# Patient Record
Sex: Female | Born: 1975
Health system: Southern US, Community
[De-identification: ages and names within clinical notes are randomized; demographics above are authoritative.]

## PROBLEM LIST (undated history)

## (undated) ENCOUNTER — Emergency Department (HOSPITAL_COMMUNITY): Admission: EM | Payer: BC Managed Care – PPO

## (undated) DIAGNOSIS — D649 Anemia, unspecified: Secondary | ICD-10-CM

## (undated) HISTORY — DX: Anemia, unspecified: D64.9

## (undated) HISTORY — PX: TUBAL LIGATION: SHX77

---

## 2016-02-22 ENCOUNTER — Encounter (HOSPITAL_COMMUNITY): Payer: Self-pay | Admitting: *Deleted

## 2016-02-22 ENCOUNTER — Emergency Department (HOSPITAL_COMMUNITY)
Admission: EM | Admit: 2016-02-22 | Discharge: 2016-02-22 | Disposition: A | Discharge status: Home / Self Care | Payer: Self-pay | Attending: Emergency Medicine | Admitting: Emergency Medicine

## 2016-02-22 DIAGNOSIS — H6123 Impacted cerumen, bilateral: Secondary | ICD-10-CM | POA: Insufficient documentation

## 2016-02-22 DIAGNOSIS — J029 Acute pharyngitis, unspecified: Secondary | ICD-10-CM

## 2016-02-22 DIAGNOSIS — J039 Acute tonsillitis, unspecified: Secondary | ICD-10-CM | POA: Insufficient documentation

## 2016-02-22 DIAGNOSIS — Z87891 Personal history of nicotine dependence: Secondary | ICD-10-CM | POA: Insufficient documentation

## 2016-02-22 LAB — RAPID STREP SCREEN (MED CTR MEBANE ONLY): Streptococcus, Group A Screen (Direct): NEGATIVE

## 2016-02-22 MED ORDER — PHENOL 1.4 % MT LIQD
1.0000 | OROMUCOSAL | Status: DC | PRN
  Administered 2016-02-22: 1 via OROMUCOSAL
  Filled 2016-02-22: qty 177

## 2016-02-22 NOTE — ED Notes (Signed)
Declined W/C at D/C and was escorted to lobby by RN. 

## 2016-02-22 NOTE — Discharge Instructions (Signed)
Continue to stay well-hydrated. Gargle warm salt water and spit it out. Use chloraseptic spray as needed for sore throat. Continue to alternate between Tylenol and Ibuprofen for pain or fever. If you develop cough, try using Mucinex for cough suppression/expectoration of mucus. If you develop any runny nose or sinus congestion then use netipot and flonase to help with nasal congestion. May consider over-the-counter Benadryl or other antihistamine to decrease secretions and for help with your symptoms. Your ears both had a lot of wax in them, you may want to consider over the counter ear drops to soften the wax so it comes out on its own; DO NOT USE QTIPS! Follow up with your primary care doctor in 5-7 days for recheck of ongoing symptoms. Return to emergency department for emergent changing or worsening of symptoms.

## 2016-02-22 NOTE — ED Triage Notes (Signed)
PT reports sore throat ,ear pain and HA started on SAt. Pt reports she has had strep throat in the past.

## 2016-02-22 NOTE — ED Notes (Signed)
"   Video Visit Coupon # [id number] [facility] " given to PT

## 2016-02-22 NOTE — ED Provider Notes (Signed)
[facility]Y [facility] Provider Note   CSN: [id number] Arrival date & time: 02/22/16  0702     History   Chief Complaint Chief Complaint  Patient presents with  . Sore Throat    HPI Glenda Harrell is a 41 y.o. female who presents to the ED with complaints of sore throat 1 day. She describes her pain as 4/10 constant soreness in the posterior throat radiating to the right ear, worse with swallowing, and with no treatments tried prior to arrival. 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 She denies any drooling, trismus, ear drainage, rhinorrhea, cough, fevers, chills, CP, SOB, abd pain, N/V/D/C, hematuria, dysuria, myalgias, arthralgias, numbness, tingling, weakness, or any other complaints at this time.    The history is provided by the patient and medical records. No language interpreter was used.  Sore Throat  This is a new problem. The current episode started yesterday. The problem occurs constantly. The problem has not changed since onset.Pertinent negatives include no chest pain, no abdominal pain and no shortness of breath. The symptoms are aggravated by swallowing. Nothing relieves the symptoms. She has tried nothing for the symptoms. The treatment provided no relief.    History reviewed. No pertinent past medical history.  There are no active problems to display for this patient.   Past Surgical History:  Procedure Laterality Date  . TUBAL LIGATION Bilateral     OB History    No data available       Home Medications    Prior to Admission medications   Not on File    Family History History reviewed. No pertinent family history.  Social History Social History  Substance Use Topics  . Smoking status: Former Games developer  . Smokeless tobacco: Former Neurosurgeon  . Alcohol  use Yes     Comment: social     Allergies   Other   Review of Systems Review of Systems  Constitutional: Negative for chills and fever.  HENT: Positive for ear pain and sore throat. Negative for drooling, ear discharge, rhinorrhea and trouble swallowing.   Respiratory: Negative for cough and shortness of breath.   Cardiovascular: Negative for chest pain.  Gastrointestinal: Negative for abdominal pain, constipation, diarrhea, nausea and vomiting.  Genitourinary: Negative for dysuria and hematuria.  Musculoskeletal: Negative for arthralgias and myalgias.  Skin: Negative for color change.  Allergic/Immunologic: Negative for immunocompromised state.  Neurological: Negative for weakness and numbness.  Psychiatric/Behavioral: Negative for confusion.   10 Systems reviewed and are negative for acute change except as noted in the HPI.   Physical Exam Updated Vital Signs BP 123/73 (BP Location: Right Arm)   Pulse 79   Temp 99.4 F (37.4 C) (Oral)   Resp 20   LMP 02/22/2016   SpO2 98%   Physical Exam  Constitutional: She is oriented to person, place, and time. Vital signs are normal. She appears well-developed and well-nourished.  Non-toxic appearance. No distress.  Afebrile, nontoxic, NAD  HENT:  Head: Normocephalic and atraumatic.  Right Ear: Hearing and external ear normal. A foreign body (cerumen impaction) is present.  Left Ear: Hearing and external ear normal. A foreign body (cerumen impaction) is present.  Nose: Nose normal.  Mouth/Throat: Uvula is midline and mucous membranes are normal. No trismus in the jaw. No  uvula swelling. Posterior oropharyngeal edema and posterior oropharyngeal erythema present. No oropharyngeal exudate or tonsillar abscesses. Tonsils are 2+ on the right. Tonsils are 2+ on the left. No tonsillar exudate.  Ear canals with cerumen impactions bilaterally, so unable to visualize TM. Nose clear. Oropharynx difficult to fully visualize due to pt not fully  cooperating well with exam, but overall injected without uvular swelling or deviation, no trismus or drooling, with 2+ bilateral tonsillar swelling and erythema, no exudates.  No evidence of ludwig's or PTA  Eyes: Conjunctivae and EOM are normal. Right eye exhibits no discharge. Left eye exhibits no discharge.  Neck: Normal range of motion. Neck supple.  Cardiovascular: Normal rate and intact distal pulses.   Pulmonary/Chest: Effort normal. No respiratory distress.  Abdominal: Normal appearance. She exhibits no distension.  Musculoskeletal: Normal range of motion.  Lymphadenopathy:       Head (right side): Tonsillar adenopathy present.       Head (left side): Tonsillar adenopathy present.  Tonsillar LAD bilaterally, tender on the R and nontender on the L  Neurological: She is alert and oriented to person, place, and time. She has normal strength. No sensory deficit.  Skin: Skin is warm, dry and intact. No rash noted.  Psychiatric: She has a normal mood and affect. Her behavior is normal.  Nursing note and vitals reviewed.    ED Treatments / Results  Labs (all labs ordered are listed, but only abnormal results are displayed) Labs Reviewed  RAPID STREP SCREEN (NOT AT [facility])  CULTURE, GROUP A STREP [facility])    EKG  EKG Interpretation None       Radiology No results found.  Procedures Procedures (including critical care time)  Medications Ordered in ED Medications  phenol (CHLORASEPTIC) mouth spray 1 spray (not administered)     Initial Impression / Assessment and Plan / ED Course  I have reviewed the triage vital signs and the nursing notes.  Pertinent labs & imaging results that were available during my care of the patient were reviewed by me and considered in my medical decision making (see chart for details).  Clinical Course     41 y.o. female here with sore throat x1 day which radiates to R ear. Hx of strep. On exam, tonsils 2+ bilaterally with erythema but no  visible exudates although pt not greatly cooperative with visualization of posterior oropharynx; handling secretions , no evidence of ludwig's or PTA. Ears with bilateral cerumen impaction so unable to visualize fully, but pt states ear pain is from the throat pain. Moderate R tonsillar LAD tenderness, L tonsillar LAD nontender. Will obtain RST and give chloraseptic spray. Will reassess shortly  8:11 AM RST neg. Likely viral etiology. Advised OTC remedies to help with symptoms. Also advised OTC ear drops for ear wax if desired, but AVOID QTIP use instructed. F/up with PCP in 1wk for recheck. I explained the diagnosis and have given explicit precautions to return to the ER including for any other new or worsening symptoms. The patient understands and accepts the medical plan as it's been dictated and I have answered their questions. Discharge instructions concerning home care and prescriptions have been given. The patient is STABLE and is discharged to home in good condition.   Final Clinical Impressions(s) / ED Diagnoses   Final diagnoses:  Sore throat  Tonsillitis  Bilateral impacted cerumen    New Prescriptions New Prescriptions   No medications on file     [address] [place] 02/22/16 [phone]  Melene Planan Floyd, DO 02/23/16 1054

## 2016-02-24 LAB — CULTURE, GROUP A STREP (THRC)

## 2016-04-13 ENCOUNTER — Encounter (HOSPITAL_COMMUNITY): Payer: Self-pay | Admitting: Emergency Medicine

## 2016-04-13 ENCOUNTER — Ambulatory Visit (HOSPITAL_COMMUNITY)
Admission: EM | Admit: 2016-04-13 | Discharge: 2016-04-13 | Disposition: A | Discharge status: Home / Self Care | Payer: Self-pay | Attending: Family Medicine | Admitting: Family Medicine

## 2016-04-13 DIAGNOSIS — M542 Cervicalgia: Secondary | ICD-10-CM

## 2016-04-13 MED ORDER — CYCLOBENZAPRINE HCL 10 MG PO TABS: 10.0000 mg | ORAL_TABLET | Freq: Two times a day (BID) | ORAL | 0 refills | Status: DC | PRN

## 2016-04-13 MED ORDER — NAPROXEN 500 MG PO TABS: 500.0000 mg | ORAL_TABLET | Freq: Two times a day (BID) | ORAL | 0 refills | Status: DC

## 2016-04-13 MED FILL — CYCLOBENZAPRINE 10 MG TAB: 10 | 10 days supply | Qty: 20 | Fill #0

## 2016-04-13 MED FILL — NAPROXEN 500 MG TABLET: 500 | 10 days supply | Qty: 20 | Fill #0

## 2016-04-13 NOTE — ED Provider Notes (Signed)
CSN: [id number]     Arrival date & time 04/13/16  1041 History   None    Chief Complaint  Patient presents with  . Torticollis  . Shoulder Pain   (Consider location/radiation/quality/duration/timing/severity/associated sxs/prior Treatment) Patient c/o neck stiffness and left shoulder discomfort starting yesterday.  Patient awoke with the neck discomfort.   The history is provided by the patient.  Shoulder Pain  Location:  Shoulder Shoulder location:  L shoulder Injury: no   Pain details:    Quality:  Aching   Radiates to:  Does not radiate   Severity:  Moderate   Onset quality:  Sudden   Duration:  2 days   Timing:  Constant   Progression:  Worsening Handedness:  Right-handed Dislocation: no   Foreign body present:  No foreign bodies Tetanus status:  Unknown Prior injury to area:  Yes Relieved by:  Nothing Worsened by:  Nothing   History reviewed. No pertinent past medical history. Past Surgical History:  Procedure Laterality Date  . TUBAL LIGATION Bilateral    History reviewed. No pertinent family history. Social History  Substance Use Topics  . Smoking status: Former Games developermoker  . Smokeless tobacco: Former NeurosurgeonUser  . Alcohol use Yes     Comment: social   OB History    No data available     Review of Systems  Constitutional: Negative.   HENT: Negative.   Eyes: Negative.   Respiratory: Negative.   Cardiovascular: Negative.   Gastrointestinal: Negative.   Endocrine: Negative.   Genitourinary: Negative.   Musculoskeletal: Positive for arthralgias.  Skin: Negative.   Allergic/Immunologic: Negative.   Neurological: Negative.   Hematological: Negative.   Psychiatric/Behavioral: Negative.     Allergies  Other  Home Medications   Prior to Admission medications   Medication Sig Start Date End Date Taking? Authorizing Provider  cyclobenzaprine (FLEXERIL) 10 MG tablet Take 1 tablet (10 mg total) by mouth 2 (two) times daily as needed for muscle spasms. 04/13/16    Deatra CanterWilliam J Breuna Loveall, FNP  naproxen (NAPROSYN) 500 MG tablet Take 1 tablet (500 mg total) by mouth 2 (two) times daily with a meal. 04/13/16   Deatra CanterWilliam J Victoria Euceda, FNP   Meds Ordered and Administered this Visit  Medications - No data to display  BP (!) 113/53 (BP Location: Right Arm)   Pulse 76   Temp 98 F (36.7 C) (Oral)   Resp 18   SpO2 100%  No data found.   Physical Exam  Constitutional: She appears well-developed and well-nourished.  HENT:  Head: Normocephalic and atraumatic.  Eyes: Conjunctivae and EOM are normal. Pupils are equal, round, and reactive to light.  Neck: Normal range of motion. Neck supple.  Cardiovascular: Normal rate, regular rhythm and normal heart sounds.   Pulmonary/Chest: Effort normal and breath sounds normal.  Musculoskeletal: She exhibits tenderness.  TTP cervical paraspinous muscles.  Left trapezius muscle discomfort.  Nursing note and vitals reviewed.   Urgent Care Course     Procedures (including critical care time)  Labs Review Labs Reviewed - No data to display  Imaging Review No results found.   Visual Acuity Review  Right Eye Distance:   Left Eye Distance:   Bilateral Distance:    Right Eye Near:   Left Eye Near:    Bilateral Near:         MDM   1. Cervicalgia   2. Neck pain    Naprosyn 500mg  one po bid x 10 days #20 Flexeril 10mg  one po  bid prn #20      Deatra Canter, FNP 04/13/16 1222

## 2016-04-13 NOTE — ED Triage Notes (Signed)
The patient presented to the [facility] with a complaint of neck stiffness and shoulder pain that started yesterday as a result of a previous injury.

## 2020-02-12 ENCOUNTER — Other Ambulatory Visit: Payer: Self-pay

## 2020-02-12 ENCOUNTER — Other Ambulatory Visit (HOSPITAL_COMMUNITY): Payer: Self-pay | Admitting: Urgent Care

## 2020-02-12 ENCOUNTER — Ambulatory Visit (HOSPITAL_COMMUNITY)
Admission: EM | Admit: 2020-02-12 | Discharge: 2020-02-12 | Disposition: A | Discharge status: Home / Self Care | Payer: BC Managed Care – PPO | Attending: Urgent Care | Admitting: Urgent Care

## 2020-02-12 ENCOUNTER — Encounter (HOSPITAL_COMMUNITY): Payer: Self-pay | Admitting: Emergency Medicine

## 2020-02-12 DIAGNOSIS — Z20822 Contact with and (suspected) exposure to covid-19: Secondary | ICD-10-CM | POA: Insufficient documentation

## 2020-02-12 DIAGNOSIS — F17200 Nicotine dependence, unspecified, uncomplicated: Secondary | ICD-10-CM | POA: Diagnosis present

## 2020-02-12 DIAGNOSIS — J069 Acute upper respiratory infection, unspecified: Secondary | ICD-10-CM

## 2020-02-12 MED ORDER — CETIRIZINE HCL 10 MG PO TABS: 10.0000 mg | ORAL_TABLET | Freq: Every day | ORAL | 0 refills | Status: DC

## 2020-02-12 MED ORDER — PROMETHAZINE-DM 6.25-15 MG/5ML PO SYRP: 5.0000 mL | ORAL_SOLUTION | Freq: Every evening | ORAL | 0 refills | Status: DC | PRN

## 2020-02-12 MED ORDER — BENZONATATE 100 MG PO CAPS
100.0000 mg | ORAL_CAPSULE | Freq: Three times a day (TID) | ORAL | 0 refills | Status: DC | PRN
Start: 2020-02-12 — End: 2020-02-12

## 2020-02-12 MED ORDER — PSEUDOEPHEDRINE HCL 60 MG PO TABS: 60.0000 mg | ORAL_TABLET | Freq: Three times a day (TID) | ORAL | 0 refills | Status: DC | PRN

## 2020-02-12 MED FILL — BENZONATATE 100 MG CAPS: 100 | 10 days supply | Qty: 60 | Fill #0

## 2020-02-12 MED FILL — PROMETHAZINE W/DM SYRUP: 6.25-15 | 20 days supply | Qty: 100 | Fill #0

## 2020-02-12 NOTE — ED Triage Notes (Signed)
Patient c/o generalized body aches and nonproductive cough x 1 day.   Patient denies fever at home.   Patient took Alkaseltzer OTC medication w/ no relief of symptoms.

## 2020-02-12 NOTE — Discharge Instructions (Addendum)

## 2020-02-12 NOTE — ED Provider Notes (Signed)
  [facility]   MRN: [id number] DOB: [birth year]  Subjective:   Glenda Harrell is a 45 y.o. female presenting for 1 day history of body aches, dry cough, upper back pain. Spent a lot of time driving, works for [employer]. Denies history of asthma. Denies chest pain, shob. She is a smoker. No traumas, falls. She is COVID vaccinated.   No current facility-administered medications for this encounter.  Current Outpatient Medications:  .  cyclobenzaprine (FLEXERIL) 10 MG tablet, Take 1 tablet (10 mg total) by mouth 2 (two) times daily as needed for muscle spasms., Disp: 20 tablet, Rfl: 0 .  naproxen (NAPROSYN) 500 MG tablet, Take 1 tablet (500 mg total) by mouth 2 (two) times daily with a meal., Disp: 20 tablet, Rfl: 0   Allergies  Allergen Reactions  . Other Anaphylaxis    Shell fish and horseradish    No past medical history on file.   Past Surgical History:  Procedure Laterality Date  . TUBAL LIGATION Bilateral     No family history on file.  Social History   Tobacco Use  . Smoking status: Former Games developer  . Smokeless tobacco: Former Engineer, water Use Topics  . Alcohol use: Yes    Comment: social  . Drug use: No    ROS   Objective:   Vitals: Ht 5\' 9"  (1.753 m)   Wt 165 lb (74.8 kg)   BMI 24.37 kg/m   Physical Exam Constitutional:      General: She is not in acute distress.    Appearance: Normal appearance. She is well-developed. She is obese. She is not ill-appearing, toxic-appearing or diaphoretic.  HENT:     Head: Normocephalic and atraumatic.     Nose: Nose normal.     Mouth/Throat:     Mouth: Mucous membranes are moist.  Eyes:     Extraocular Movements: Extraocular movements intact.     Pupils: Pupils are equal, round, and reactive to light.  Cardiovascular:     Rate and Rhythm: Normal rate and regular rhythm.     Pulses: Normal pulses.     Heart sounds: Normal heart sounds. No murmur heard. No friction rub. No gallop.   Pulmonary:      Effort: Pulmonary effort is normal. No respiratory distress.     Breath sounds: Normal breath sounds. No stridor. No wheezing, rhonchi or rales.  Skin:    General: Skin is warm and dry.     Findings: No rash.  Neurological:     Mental Status: She is alert and oriented to person, place, and time.  Psychiatric:        Mood and Affect: Mood normal.        Behavior: Behavior normal.        Thought Content: Thought content normal.     Assessment and Plan :   PDMP not reviewed this encounter.  1. Viral URI with cough   2. Smoker     Will manage for viral illness such as viral URI, viral syndrome, viral rhinitis, COVID-19. Counseled patient on nature of COVID-19 including modes of transmission, diagnostic testing, management and supportive care.  Offered scripts for symptomatic relief. COVID 19 testing is pending. Counseled patient on potential for adverse effects with medications prescribed/recommended today, ER and return-to-clinic precautions discussed, patient verbalized understanding.     , PA-C 02/12/20 1521

## 2020-02-13 LAB — SARS CORONAVIRUS 2 (TAT 6-24 HRS): SARS Coronavirus 2: NEGATIVE

## 2020-06-19 ENCOUNTER — Other Ambulatory Visit: Payer: Self-pay

## 2020-06-19 ENCOUNTER — Encounter: Payer: Self-pay | Admitting: Nurse Practitioner

## 2020-06-19 ENCOUNTER — Ambulatory Visit (INDEPENDENT_AMBULATORY_CARE_PROVIDER_SITE_OTHER): Payer: BC Managed Care – PPO | Admitting: Nurse Practitioner

## 2020-06-19 VITALS — BP 128/74 | HR 59 | Temp 98.3°F | Ht 68.2 in | Wt 171.0 lb

## 2020-06-19 DIAGNOSIS — R109 Unspecified abdominal pain: Secondary | ICD-10-CM

## 2020-06-19 DIAGNOSIS — Z0000 Encounter for general adult medical examination without abnormal findings: Secondary | ICD-10-CM | POA: Diagnosis not present

## 2020-06-19 DIAGNOSIS — Z1231 Encounter for screening mammogram for malignant neoplasm of breast: Secondary | ICD-10-CM

## 2020-06-19 DIAGNOSIS — Z7689 Persons encountering health services in other specified circumstances: Secondary | ICD-10-CM

## 2020-06-19 DIAGNOSIS — H6122 Impacted cerumen, left ear: Secondary | ICD-10-CM | POA: Diagnosis not present

## 2020-06-19 DIAGNOSIS — N926 Irregular menstruation, unspecified: Secondary | ICD-10-CM | POA: Diagnosis not present

## 2020-06-19 DIAGNOSIS — E559 Vitamin D deficiency, unspecified: Secondary | ICD-10-CM | POA: Diagnosis not present

## 2020-06-19 DIAGNOSIS — R5383 Other fatigue: Secondary | ICD-10-CM | POA: Diagnosis not present

## 2020-06-19 LAB — CBC
Hematocrit: 35.1 % (ref 34.0–46.6)
Hemoglobin: 11.2 g/dL (ref 11.1–15.9)
MCH: 26 pg — ABNORMAL LOW (ref 26.6–33.0)
MCHC: 31.9 g/dL (ref 31.5–35.7)
MCV: 81 fL (ref 79–97)
Platelets: 234 10*3/uL (ref 150–450)
RBC: 4.31 x10E6/uL (ref 3.77–5.28)
RDW: 14.5 % (ref 11.7–15.4)
WBC: 4 10*3/uL (ref 3.4–10.8)

## 2020-06-19 LAB — POCT URINALYSIS DIPSTICK
Bilirubin, UA: NEGATIVE
Blood, UA: NEGATIVE
Glucose, UA: NEGATIVE
Ketones, UA: NEGATIVE
Leukocytes, UA: NEGATIVE
Nitrite, UA: NEGATIVE
Protein, UA: NEGATIVE
Spec Grav, UA: 1.015 (ref 1.010–1.025)
Urobilinogen, UA: 0.2 E.U./dL
pH, UA: 7.5 (ref 5.0–8.0)

## 2020-06-19 NOTE — Progress Notes (Signed)
I,Tianna Badgett,acting as a Education administrator for [employer], NP.,have documented all relevant documentation on the behalf of [employer], NP,as directed by  Bary Castilla, NP while in the presence of Bary Castilla, NP.  This visit occurred during the SARS-CoV-2 public health emergency.  Safety protocols were in place, including screening questions prior to the visit, additional usage of staff PPE, and extensive cleaning of exam room while observing appropriate contact time as indicated for disinfecting solutions.  Subjective:     Patient ID: Glenda Harrell , female    DOB: 1975/02/15 , 45 y.o.   MRN: [id number]   Chief Complaint  Patient presents with  . Establish Care    HPI  Patient is here to establish care. She would like a physical today. She has not been seen by a PCP since 2016 when she lived in [place]. She goes to the urgent care if she needs to.  [facility] She has had it within last year.  She is having flank pain today as well as headache. She took ibuprofen with the headache. She is stressed with work, school and work. Her BP Today was 128/74 Pulse 59. She does have anemia.  LMP: May 8th 2022. She has not had regular cycle.  Diet: She eats a lot of red meat. She eats a lot of salt. Drink Coffee. No soda. Drinks water. Eats vegetables. She doesn't eat 3 meals a day. She doenst eat pork.  Exercise: She walks.  She is not sexaully active. She has one boy. 44 years old     Past Medical History:  Diagnosis Date  . Anemia      Family History  Problem Relation Age of Onset  . Stroke Mother   . Hypertension Maternal Grandmother     No current outpatient medications on file.   Allergies  Allergen Reactions  . Other Anaphylaxis    Shell fish and horseradish      The patient states she uses none for birth control. Last LMP 06/15/20  Negative for: breast discharge, breast lump(s), breast pain and breast self exam.  Associated symptoms include abnormal vaginal bleeding. Pertinent negatives include abnormal bleeding (hematology), anxiety, decreased libido, depression, difficulty falling sleep, dyspareunia, history of infertility, nocturia, sexual dysfunction, sleep disturbances, urinary incontinence, urinary urgency, vaginal discharge and vaginal itching. Diet regular.The patient states her exercise level is    . The patient's tobacco use is: none  Social History   Tobacco Use  Smoking Status Current Every Day Smoker  . Packs/day: 0.50  . Types: Cigarettes  Smokeless Tobacco Former Systems developer  . She has been exposed to passive smoke. The patient's alcohol use is:  Social History   Substance and Sexual Activity  Alcohol Use Yes   Comment: social  . Additional information: Last pap (last year patient had it done with school OBG[facility]).  Review of Systems  Constitutional: Negative.  Negative for chills, fatigue and fever.  HENT: Negative.  Negative for congestion, ear discharge, sinus pressure and sinus pain.   Eyes: Positive for visual disturbance.       Patient to make appt with eye doc  Respiratory: Negative.  Negative for shortness of breath and wheezing.   Cardiovascular: Negative.  Negative for chest pain and palpitations.  Gastrointestinal: Negative.  Negative for constipation, diarrhea, nausea and vomiting.  Endocrine: Negative.  Negative for polydipsia, polyphagia and polyuria.  Genitourinary: Positive for flank pain.  Musculoskeletal: Negative for arthralgias, back pain  and myalgias.  Skin: Negative.   Allergic/Immunologic: Negative.   Neurological: Positive for headaches. Negative for numbness.  Hematological: Negative.   Psychiatric/Behavioral: Negative.      Today's Vitals   06/19/20 1055  BP: 128/74  Pulse: (!) 59  Temp: 98.3 F (36.8 C)  TempSrc: Oral  Weight: 171 lb (77.6 kg)  Height: 5' 8.2" (1.732 m)   Body mass index is 25.85 kg/m.   Objective:  Physical Exam Vitals and  nursing note reviewed.  Constitutional:      Appearance: Normal appearance.  HENT:     Head: Normocephalic and atraumatic.     Right Ear: Tympanic membrane, ear canal and external ear normal.     Left Ear: Tympanic membrane, ear canal and external ear normal. There is impacted cerumen.     Nose: Nose normal.     Mouth/Throat:     Mouth: Mucous membranes are moist.     Pharynx: Oropharynx is clear.  Eyes:     Extraocular Movements: Extraocular movements intact.     Conjunctiva/sclera: Conjunctivae normal.     Pupils: Pupils are equal, round, and reactive to light.  Cardiovascular:     Rate and Rhythm: Normal rate and regular rhythm.     Pulses: Normal pulses.     Heart sounds: Normal heart sounds.  Pulmonary:     Effort: Pulmonary effort is normal.     Breath sounds: Normal breath sounds.  Chest:  Breasts:     Tanner Score is 5.     Right: Normal.     Left: Normal.    Abdominal:     General: Abdomen is flat. Bowel sounds are normal.     Palpations: Abdomen is soft.  Genitourinary:    Comments: Deferred will see a OBYGN  Musculoskeletal:        General: Normal range of motion.     Cervical back: Normal range of motion and neck supple.  Skin:    General: Skin is warm and dry.     Capillary Refill: Capillary refill takes less than 2 seconds.  Neurological:     General: No focal deficit present.     Mental Status: She is alert and oriented to person, place, and time.  Psychiatric:        Mood and Affect: Mood normal.        Behavior: Behavior normal.         Assessment And Plan:     1. Establishing care with new doctor, encounter for -Patient is here to establish care. Martin Majestic over patient medical, family, social and surgical history. -Reviewed with patient their medications and any allergies  -Reviewed with patient their sexual orientation, drug/tobacco and alcohol use -Dicussed any new concerns with patient  -recommended patient comes in for a physical exam and  complete blood work.  -Educated patient about the importance of annual screenings and immunizations.  -Advised patient to eat a healthy diet along with exercise for atleast 30-45 min atleast 4-5 days of the week.   2. Encounter for annual physical exam --Patient is here for their annual physical exam and we discussed any changes to medication and medical history.  -Behavior modification was discussed as well as diet and exercise history  -Patient will continue to exercise regularly and modify their diet.  -Recommendation for yearly physical annuals, immunization and screenings including mammogram and colonoscopy were discussed with the patient.  -Recommended intake of multivitamin, vitamin D and calcium.  -Individualized advise was given to the patient  pertaining to their own health history in regards to diet, exercise, medical condition and referrals.  - CBC - Hemoglobin A1c - CMP14+EGFR - Lipid panel - Hepatitis C antibody - HIV Antibody (routine testing w rflx)  3. Irregular menses - Ambulatory referral to Obstetrics / Gynecology  4. Fatigue, unspecified type - TSH + free T4 - T3, free  5. Vitamin D deficiency -Will assess and supplement as needed. Advised patient to spend 15 min. In the sunlight.   - Vitamin D (25 hydroxy)  6. Flank pain - POCT Urinalysis Dipstick (81002)  7. Impacted cerumen of left ear -Irrigated left ear with curette.   8. Screening mammogram, encounter for - MM Digital Screening; Future  Staying healthy and adopting a healthy lifestyle for your overall health is important. You should eat 7 or more servings of fruits and vegetables per day. You should drink plenty of water to keep yourself hydrated and your kidneys healthy. This includes about 65-80+ fluid ounces of water. Limit your intake of animal fats especially for elevated cholesterol. Avoid highly processed food and limit your salt intake if you have hypertension. Avoid foods high in  saturated/Trans fats. Along with a healthy diet it is also very important to maintain time for yourself to maintain a healthy mental health with low stress levels. You should get atleast 150 min of moderate intensity exercise weekly for a healthy heart. Along with eating right and exercising, aim for at least 7-9 hours of sleep daily.  Eat more whole grains which includes barley, wheat berries, oats, brown rice and whole wheat pasta. Use healthy plant oils which include olive, soy, corn, sunflower and peanut. Limit your caffeine and sugary drinks. Limit your intake of fast foods. Limit milk and dairy products to one or two daily servings.    Patient was given opportunity to ask questions. Patient verbalized understanding of the plan and was able to repeat key elements of the plan. All questions were answered to their satisfaction.  Glenda Rathana Viveros, DNP   I, Glenda Harrell have reviewed all documentation for this visit. The documentation on 06/19/20 for the exam, diagnosis, procedures, and orders are all accurate and complete.    THE PATIENT IS ENCOURAGED TO PRACTICE SOCIAL DISTANCING DUE TO THE COVID-19 PANDEMIC.

## 2020-06-25 ENCOUNTER — Other Ambulatory Visit (HOSPITAL_COMMUNITY): Payer: Self-pay

## 2020-06-25 ENCOUNTER — Other Ambulatory Visit: Payer: Self-pay | Admitting: Nurse Practitioner

## 2020-06-25 DIAGNOSIS — E7800 Pure hypercholesterolemia, unspecified: Secondary | ICD-10-CM

## 2020-06-25 DIAGNOSIS — E559 Vitamin D deficiency, unspecified: Secondary | ICD-10-CM

## 2020-06-25 LAB — LIPID PANEL
Chol/HDL Ratio: 3 ratio (ref 0.0–4.4)
Cholesterol, Total: 238 mg/dL — ABNORMAL HIGH (ref 100–199)
HDL: 79 mg/dL (ref >39)
LDL Chol Calc (NIH): 142 mg/dL — ABNORMAL HIGH (ref 0–99)
Triglycerides: 96 mg/dL (ref 0–149)
VLDL Cholesterol Cal: 17 mg/dL (ref 5–40)

## 2020-06-25 LAB — CMP14+EGFR
ALT: 10 IU/L (ref 0–32)
AST: 19 IU/L (ref 0–40)
Albumin/Globulin Ratio: 1.9 (ref 1.2–2.2)
Albumin: 4.4 g/dL (ref 3.8–4.8)
Alkaline Phosphatase: 92 IU/L (ref 44–121)
BUN/Creatinine Ratio: 9 (ref 9–23)
BUN: 7 mg/dL (ref 6–24)
Bilirubin Total: <0.2 mg/dL (ref 0.0–1.2)
CO2: 23 mmol/L (ref 20–29)
Calcium: 9.1 mg/dL (ref 8.7–10.2)
Chloride: 104 mmol/L (ref 96–106)
Creatinine, Ser: 0.81 mg/dL (ref 0.57–1.00)
Globulin, Total: 2.3 g/dL (ref 1.5–4.5)
Glucose: 86 mg/dL (ref 65–99)
Potassium: 4.8 mmol/L (ref 3.5–5.2)
Sodium: 140 mmol/L (ref 134–144)
Total Protein: 6.7 g/dL (ref 6.0–8.5)
eGFR: 92 mL/min/{1.73_m2} (ref >59)

## 2020-06-25 LAB — T3, FREE: T3, Free: 2.9 pg/mL (ref 2.0–4.4)

## 2020-06-25 LAB — HEMOGLOBIN A1C
Est. average glucose Bld gHb Est-mCnc: 126 mg/dL
Hgb A1c MFr Bld: 6 % — ABNORMAL HIGH (ref 4.8–5.6)

## 2020-06-25 LAB — TSH+FREE T4
Free T4: 1.11 ng/dL (ref 0.82–1.77)
TSH: 1.12 u[IU]/mL (ref 0.450–4.500)

## 2020-06-25 LAB — HEPATITIS C ANTIBODY: Hep C Virus Ab: <0.1 s/co ratio (ref 0.0–0.9)

## 2020-06-25 LAB — VITAMIN D 25 HYDROXY (VIT D DEFICIENCY, FRACTURES): Vit D, 25-Hydroxy: 17.9 ng/mL — ABNORMAL LOW (ref 30.0–100.0)

## 2020-06-25 LAB — HIV ANTIBODY (ROUTINE TESTING W REFLEX): HIV Screen 4th Generation wRfx: NONREACTIVE

## 2020-06-25 MED ORDER — VITAMIN D (ERGOCALCIFEROL) 1.25 MG (50000 UNIT) PO CAPS
ORAL_CAPSULE | ORAL | 0 refills | Status: DC
  Filled 2020-06-25: qty 24, 84d supply, fill #0

## 2020-06-25 MED ORDER — ATORVASTATIN CALCIUM 10 MG PO TABS
10.0000 mg | ORAL_TABLET | Freq: Every day | ORAL | 5 refills | Status: DC
  Filled 2020-06-25: qty 30, 30d supply, fill #0

## 2020-06-26 ENCOUNTER — Other Ambulatory Visit (HOSPITAL_COMMUNITY): Payer: Self-pay

## 2020-07-04 ENCOUNTER — Other Ambulatory Visit (HOSPITAL_COMMUNITY): Payer: Self-pay

## 2020-07-09 ENCOUNTER — Ambulatory Visit
Admission: RE | Admit: 2020-07-09 | Discharge: 2020-07-09 | Disposition: A | Discharge status: Home / Self Care | Payer: BC Managed Care – PPO | Source: Ambulatory Visit | Attending: Nurse Practitioner | Admitting: Nurse Practitioner

## 2020-07-09 ENCOUNTER — Other Ambulatory Visit: Payer: Self-pay

## 2020-07-09 DIAGNOSIS — Z1231 Encounter for screening mammogram for malignant neoplasm of breast: Secondary | ICD-10-CM

## 2020-07-14 ENCOUNTER — Other Ambulatory Visit: Payer: Self-pay | Admitting: Nurse Practitioner

## 2020-07-14 DIAGNOSIS — R928 Other abnormal and inconclusive findings on diagnostic imaging of breast: Secondary | ICD-10-CM

## 2020-08-05 ENCOUNTER — Other Ambulatory Visit: Payer: Self-pay | Admitting: Nurse Practitioner

## 2020-08-06 ENCOUNTER — Other Ambulatory Visit: Payer: Self-pay

## 2020-08-06 ENCOUNTER — Ambulatory Visit
Admission: RE | Admit: 2020-08-06 | Discharge: 2020-08-06 | Disposition: A | Discharge status: Home / Self Care | Payer: BC Managed Care – PPO | Source: Ambulatory Visit | Attending: Nurse Practitioner | Admitting: Nurse Practitioner

## 2020-08-06 ENCOUNTER — Other Ambulatory Visit: Payer: Self-pay | Admitting: Nurse Practitioner

## 2020-08-06 DIAGNOSIS — N6320 Unspecified lump in the left breast, unspecified quadrant: Secondary | ICD-10-CM

## 2020-08-06 DIAGNOSIS — R928 Other abnormal and inconclusive findings on diagnostic imaging of breast: Secondary | ICD-10-CM

## 2020-08-15 ENCOUNTER — Ambulatory Visit
Admission: RE | Admit: 2020-08-15 | Discharge: 2020-08-15 | Disposition: A | Discharge status: Home / Self Care | Payer: BC Managed Care – PPO | Source: Ambulatory Visit | Attending: Nurse Practitioner | Admitting: Nurse Practitioner

## 2020-08-15 DIAGNOSIS — N6320 Unspecified lump in the left breast, unspecified quadrant: Secondary | ICD-10-CM

## 2021-06-22 ENCOUNTER — Encounter (HOSPITAL_COMMUNITY): Payer: Self-pay | Admitting: Emergency Medicine

## 2021-06-22 ENCOUNTER — Emergency Department (HOSPITAL_COMMUNITY): Payer: BLUE CROSS/BLUE SHIELD

## 2021-06-22 ENCOUNTER — Other Ambulatory Visit: Payer: Self-pay

## 2021-06-22 ENCOUNTER — Emergency Department (HOSPITAL_COMMUNITY)
Admission: EM | Admit: 2021-06-22 | Discharge: 2021-06-22 | Disposition: A | Discharge status: Home / Self Care | Payer: BLUE CROSS/BLUE SHIELD | Attending: Emergency Medicine | Admitting: Emergency Medicine

## 2021-06-22 DIAGNOSIS — M545 Low back pain, unspecified: Secondary | ICD-10-CM | POA: Diagnosis present

## 2021-06-22 DIAGNOSIS — M791 Myalgia, unspecified site: Secondary | ICD-10-CM | POA: Diagnosis not present

## 2021-06-22 DIAGNOSIS — M47896 Other spondylosis, lumbar region: Secondary | ICD-10-CM | POA: Diagnosis not present

## 2021-06-22 DIAGNOSIS — M47816 Spondylosis without myelopathy or radiculopathy, lumbar region: Secondary | ICD-10-CM

## 2021-06-22 MED ORDER — DICLOFENAC SODIUM 50 MG PO TBEC
50.0000 mg | DELAYED_RELEASE_TABLET | Freq: Two times a day (BID) | ORAL | 0 refills | Status: AC
Start: 2021-06-22 — End: 2021-07-02

## 2021-06-22 MED ORDER — METHOCARBAMOL 500 MG PO TABS: 500.0000 mg | ORAL_TABLET | Freq: Two times a day (BID) | ORAL | 0 refills | Status: DC

## 2021-06-22 MED ORDER — LIDOCAINE 5 % EX PTCH
1.0000 | MEDICATED_PATCH | CUTANEOUS | 0 refills | Status: DC
Start: 2021-06-22 — End: 2021-10-14

## 2021-06-22 NOTE — ED Triage Notes (Signed)
Pt reports right sided back / flank pain x 2 days ago. Denies urinary s/s.  ?

## 2021-06-22 NOTE — ED Provider Notes (Signed)
?[facility]-EMERGENCY DEPT ?Provider Note ? ? ?CSN: [id number] ?Arrival date & time: 06/22/21  1756 ? ?  ? ?History ? ?Chief Complaint  ?Patient presents with  ? Back Pain  ? ? ?Glenda Harrell is a 46 y.o. female. ? ?46 year old female presents with complaint of pain in her right lower back onset 2 days ago without injury or fall, denies fevers, abdominal pain, changes in bowel or bladder habits.  Pain is worse with standing.  Patient is taking ibuprofen with limited relief.  No other complaints or concerns today.  Patient is otherwise healthy. ? ? ?  ? ?Home Medications ?Prior to Admission medications   ?Medication Sig Start Date End Date Taking? Authorizing Provider  ?diclofenac (VOLTAREN) 50 MG EC tablet Take 1 tablet (50 mg total) by mouth 2 (two) times daily for 10 days. 06/22/21 07/02/21 Yes Jeannie Fend, PA-C  ?lidocaine (LIDODERM) 5 % Place 1 patch onto the skin daily. Remove & Discard patch within 12 hours or as directed by MD 06/22/21  Yes Jeannie Fend, PA-C  ?methocarbamol (ROBAXIN) 500 MG tablet Take 1 tablet (500 mg total) by mouth 2 (two) times daily. 06/22/21  Yes Jeannie Fend, PA-C  ?atorvastatin (LIPITOR) 10 MG tablet Take 1 tablet (10 mg total) by mouth daily. 06/25/20   Charlesetta Ivory, NP  ?Vitamin D, Ergocalciferol, (DRISDOL) 1.25 MG (50000 UNIT) CAPS capsule Take 1 capsule by mouth on Tuesday and 1 capsule on Fridays. 06/25/20   Charlesetta Ivory, NP  ?   ? ?Allergies    ?Other   ? ?Review of Systems   ?Review of Systems ?Negative except as per HPI ?Physical Exam ?Updated Vital Signs ?BP (!) 141/85 (BP Location: Left Arm)   Pulse 67   Temp 98.7 ?F (37.1 ?C) (Oral)   Resp 18   SpO2 100%  ?Physical Exam ?Vitals and nursing note reviewed.  ?Constitutional:   ?   General: She is not in acute distress. ?   Appearance: She is well-developed. She is not diaphoretic.  ?HENT:  ?   Head: Normocephalic and atraumatic.  ?Pulmonary:  ?   Effort: Pulmonary effort is normal.   ?Abdominal:  ?   Palpations: Abdomen is soft.  ?   Tenderness: There is no abdominal tenderness. There is no right CVA tenderness or left CVA tenderness.  ?Musculoskeletal:     ?   General: Tenderness present.  ?     Back: ? ?   Right lower leg: No edema.  ?   Left lower leg: No edema.  ?   Comments: Right paraspinous lumbar tenderness, no midline or bony tenderness, no SI tenderness.  ?Skin: ?   General: Skin is warm and dry.  ?   Findings: No erythema or rash.  ?Neurological:  ?   Mental Status: She is alert and oriented to person, place, and time.  ?   Sensory: No sensory deficit.  ?   Motor: No weakness.  ?   Gait: Gait normal.  ?Psychiatric:     ?   Behavior: Behavior normal.  ? ? ?ED Results / Procedures / Treatments   ?Labs ?(all labs ordered are listed, but only abnormal results are displayed) ?Labs Reviewed - No data to display ? ?EKG ?None ? ?Radiology ?DG Lumbar Spine Complete ? ?Result Date: 06/22/2021 ?CLINICAL DATA:  Back pain, right flank pain EXAM: LUMBAR SPINE - COMPLETE 5 VIEW COMPARISON:  None Available. FINDINGS: No recent fracture is seen. Alignment of posterior margins of  vertebral bodies is unremarkable. There is no significant disc space narrowing. Small anterior bony spurs are noted. Degenerative changes are noted in facet joints, more so at L5-S1 level. There are faint calcific densities in the pelvis with possible surgical staples. IMPRESSION: No recent fracture is seen in the lumbar spine. Mild degenerative changes are noted with small bony spurs and facet hypertrophy. Electronically Signed   By: Ernie Avena M.D.   On: 06/22/2021 19:00   ? ?Procedures ?Procedures  ? ? ?Medications Ordered in ED ?Medications - No data to display ? ?ED Course/ Medical Decision Making/ A&P ?  ?                        ?Medical Decision Making ?Amount and/or Complexity of Data Reviewed ?Radiology: ordered. ? ?Risk ?Prescription drug management. ? ? ?46yo female with right lower back pain x 2 days as  above. On exam, abdomen is soft and non tender, TTP right para spinous muscles, no tenderness at right SI. Reflexes symmetric, leg strength equal, gait normal. Suspect msk source of pain. XR lumbar spine without acute bony injury, read by radiologist as mild degenerative changes with small bony spurs and facet hypertrophy.  ?Plan is to treat with robaxin and diclofenac, recommend warm compress x 20 minutes, followed by gentle stretching, recheck with PCP for possible referral to PT if not improving.  ? ? ? ? ? ? ? ?Final Clinical Impression(s) / ED Diagnoses ?Final diagnoses:  ?Acute right-sided low back pain without sciatica  ?Osteoarthritis of lumbar spine, unspecified spinal osteoarthritis complication status  ? ? ?Rx / DC Orders ?ED Discharge Orders   ? ?      Ordered  ?  lidocaine (LIDODERM) 5 %  Every 24 hours       ? 06/22/21 1906  ?  diclofenac (VOLTAREN) 50 MG EC tablet  2 times daily       ? 06/22/21 1906  ?  methocarbamol (ROBAXIN) 500 MG tablet  2 times daily       ? 06/22/21 1906  ? ?  ?  ? ?  ? ? ?  ?Jeannie Fend, PA-C ?06/22/21 2026 ? ?  ?Lorre Nick, MD ?06/24/21 [phone] ? ?

## 2021-06-22 NOTE — Discharge Instructions (Signed)
Apply warm compresses to low back for 20 minutes at a time, follow with gentle stretching.  ?Apply lidoderm patch to back as prescribed. ?Take robaxin and diclofenac as needed as prescribed. ?Follow up with your doctor for recheck and possible PT referral if not improving with home exercises.  ?

## 2021-08-10 ENCOUNTER — Other Ambulatory Visit: Payer: Self-pay | Admitting: Nurse Practitioner

## 2021-08-10 DIAGNOSIS — Z1231 Encounter for screening mammogram for malignant neoplasm of breast: Secondary | ICD-10-CM

## 2021-09-01 ENCOUNTER — Encounter: Payer: BC Managed Care – PPO | Admitting: Radiology

## 2021-09-08 ENCOUNTER — Ambulatory Visit: Payer: BLUE CROSS/BLUE SHIELD | Admitting: Nurse Practitioner

## 2021-09-15 ENCOUNTER — Encounter: Payer: BLUE CROSS/BLUE SHIELD | Admitting: Radiology

## 2021-09-17 ENCOUNTER — Encounter: Payer: BLUE CROSS/BLUE SHIELD | Admitting: Radiology

## 2021-09-22 ENCOUNTER — Encounter: Payer: Self-pay | Admitting: Nurse Practitioner

## 2021-09-22 ENCOUNTER — Other Ambulatory Visit (HOSPITAL_COMMUNITY): Payer: Self-pay

## 2021-09-22 ENCOUNTER — Ambulatory Visit: Payer: BLUE CROSS/BLUE SHIELD | Admitting: Nurse Practitioner

## 2021-09-22 VITALS — BP 130/68 | HR 70 | Temp 98.2°F | Ht 68.0 in | Wt 171.0 lb

## 2021-09-22 DIAGNOSIS — E7800 Pure hypercholesterolemia, unspecified: Secondary | ICD-10-CM

## 2021-09-22 DIAGNOSIS — Z1211 Encounter for screening for malignant neoplasm of colon: Secondary | ICD-10-CM

## 2021-09-22 DIAGNOSIS — R001 Bradycardia, unspecified: Secondary | ICD-10-CM | POA: Diagnosis not present

## 2021-09-22 DIAGNOSIS — E559 Vitamin D deficiency, unspecified: Secondary | ICD-10-CM | POA: Diagnosis not present

## 2021-09-22 DIAGNOSIS — R7309 Other abnormal glucose: Secondary | ICD-10-CM

## 2021-09-22 DIAGNOSIS — R0683 Snoring: Secondary | ICD-10-CM | POA: Diagnosis not present

## 2021-09-22 DIAGNOSIS — R21 Rash and other nonspecific skin eruption: Secondary | ICD-10-CM

## 2021-09-22 DIAGNOSIS — Z1231 Encounter for screening mammogram for malignant neoplasm of breast: Secondary | ICD-10-CM

## 2021-09-22 MED ORDER — MOMETASONE FUROATE 0.1 % EX CREA
1.0000 | TOPICAL_CREAM | Freq: Every day | CUTANEOUS | 1 refills | Status: DC
  Filled 2021-09-22: qty 45, 30d supply, fill #0

## 2021-09-22 NOTE — Patient Instructions (Signed)

## 2021-09-22 NOTE — Progress Notes (Signed)
Barnet Glasgow Martin,acting as a Education administrator for Minette Brine, FNP.,have documented all relevant documentation on the behalf of Minette Brine, FNP,as directed by  Minette Brine, FNP while in the presence of Minette Brine, [place].    Subjective:     Patient ID: Glenda Harrell , female    DOB: [birth year] , 46 y.o.   MRN: [id number]   Chief Complaint  Patient presents with   Referral    HPI  Patient presents today for a referral to GI for an colonoscopy, and a mammogram.   Patient states she was having really bad back pain that started 2 weeks before she went to the hospital she was admitted on 06/22/21 and discharged the same day. She states it got so bad that she couldn't even walk. The hospital told her she had osteoarthritis in her lumber spine they gave her a steroid and a muscle relaxer (she took at the beginning but stopped due to not liking to take medications). Patient states she has been doing the stretches and heating pad so the pain is not as bad. Patient states she hasn't been taking any of her medications because she is trying the natural way, she has changed her diet and doing walking. Patient has stopped smoking. Patient also has color change in both of her forearms.   BP Readings from Last 3 Encounters: 09/22/21 : 130/68 06/22/21 : (!) 141/85 06/19/20 : 128/74    She has not taking her cholesterol medications does not like to take medications has changed her diet and trying to eat healthier and getting out in the sun more.      Past Medical History:  Diagnosis Date   Anemia      Family History  Problem Relation Age of Onset   Stroke Mother    Hypertension Maternal Grandmother      Current Outpatient Medications:    mometasone (ELOCON) 0.1 % cream, Apply 1 application topically to affected area daily., Disp: 45 g, Rfl: 1   atorvastatin (LIPITOR) 10 MG tablet, Take 1 tablet (10 mg total) by mouth daily. (Patient not taking: Reported on 09/22/2021), Disp: 30 tablet, Rfl: 5    lidocaine (LIDODERM) 5 %, Place 1 patch onto the skin daily. Remove & Discard patch within 12 hours or as directed by MD (Patient not taking: Reported on 09/22/2021), Disp: 30 patch, Rfl: 0   methocarbamol (ROBAXIN) 500 MG tablet, Take 1 tablet (500 mg total) by mouth 2 (two) times daily. (Patient not taking: Reported on 09/22/2021), Disp: 20 tablet, Rfl: 0   Vitamin D, Ergocalciferol, (DRISDOL) 1.25 MG (50000 UNIT) CAPS capsule, Take 1 capsule by mouth on Tuesday and 1 capsule on Fridays. (Patient not taking: Reported on 09/22/2021), Disp: 24 capsule, Rfl: 0   Allergies  Allergen Reactions   Other Anaphylaxis    Shell fish and horseradish     Review of Systems  Constitutional: Negative.   HENT: Negative.    Eyes: Negative.   Respiratory: Negative.    Cardiovascular: Negative.   Gastrointestinal: Negative.      Today's Vitals   09/22/21 1152  BP: 130/68  Pulse: 70  Temp: 98.2 F (36.8 C)  TempSrc: Oral  Weight: 171 lb (77.6 kg)  Height: _0  (1.727 m)  PainSc: 0-No pain   Body mass index is 26 kg/m.  Wt Readings from Last 3 Encounters:  09/22/21 171 lb (77.6 kg)  06/19/20 171 lb (77.6 kg)  02/12/20 165 lb (74.8 kg)     Objective:  Physical  Exam Vitals reviewed.  Constitutional:      General: She is not in acute distress.    Appearance: Normal appearance. She is well-developed.  HENT:     Head: Normocephalic and atraumatic.  Cardiovascular:     Rate and Rhythm: Normal rate and regular rhythm.     Pulses: Normal pulses.     Heart sounds: Normal heart sounds. No murmur heard. Pulmonary:     Effort: Pulmonary effort is normal. No respiratory distress.     Breath sounds: Normal breath sounds. No wheezing.  Musculoskeletal:        General: Normal range of motion.  Skin:    General: Skin is warm and dry.     Capillary Refill: Capillary refill takes less than 2 seconds.     Findings: Rash (bilateral irregular circular rash to antecubital space) present.  Neurological:      General: No focal deficit present.     Mental Status: She is alert and oriented to person, place, and time.     Cranial Nerves: No cranial nerve deficit.  Psychiatric:        Mood and Affect: Mood normal.        Behavior: Behavior normal.        Thought Content: Thought content normal.        Judgment: Judgment normal.         Assessment And Plan:     1. Hypercholesterolemia Comments: Not taking medications, will check lipid panel. Encouraged to eat low fat diet.  - Lipid panel - BMP8+eGFR  2. Vitamin D deficiency  3. Abnormal glucose Comments: Diet controlled, encouraged to continue to avoid sugar and starches. Increase physical activity. - Hemoglobin A1c  4. Snoring Comments: She has been having some bradycardia at night and has fatigue as well which has been ongoing, will send for sleep study - Ambulatory referral to Sleep Studies - EKG 12-Lead  5. Bradycardia Comments: EKG done with SR HR 58, no chest pain or shortness of breath - EKG 12-Lead - TSH  6. Rash and nonspecific skin eruption Comments: Bilateral antecubitals with slightly hyperpigmented skin and slightly rough. Will treat with steroid cream may be from excessive sweating.  - mometasone (ELOCON) 0.1 % cream; Apply 1 application topically to affected area daily.  Dispense: 45 g; Refill: 1  7. Encounter for screening colonoscopy According to USPTF Colorectal cancer Screening guidelines. Colonoscopy is recommended every 10 years, starting at age 71 years. Will refer to GI for colon cancer screening. - Ambulatory referral to Gastroenterology  8. Screening mammogram, encounter for Pt instructed on Self Breast Exam.According to ACOG guidelines Women aged 38 and older are recommended to get an annual mammogram. Form completed and given to patient contact the [facility] for appointment scheduing.  Pt encouraged to get annual mammogram - MM Digital Screening; Future     Patient was given opportunity  to ask questions. Patient verbalized understanding of the plan and was able to repeat key elements of the plan. All questions were answered to their satisfaction.  Minette Brine, FNP   I, Minette Brine, FNP, have reviewed all documentation for this visit. The documentation on 09/22/21 for the exam, diagnosis, procedures, and orders are all accurate and complete.   IF YOU HAVE BEEN REFERRED TO A SPECIALIST, IT MAY TAKE 1-2 WEEKS TO SCHEDULE/PROCESS THE REFERRAL. IF YOU HAVE NOT HEARD FROM US/SPECIALIST IN TWO WEEKS, PLEASE GIVE [place] A CALL AT [phone].   THE PATIENT IS ENCOURAGED TO  PRACTICE SOCIAL DISTANCING DUE TO THE COVID-19 PANDEMIC.

## 2021-09-23 LAB — BMP8+EGFR
BUN/Creatinine Ratio: 10 (ref 9–23)
BUN: 9 mg/dL (ref 6–24)
CO2: 24 mmol/L (ref 20–29)
Calcium: 10.2 mg/dL (ref 8.7–10.2)
Chloride: 101 mmol/L (ref 96–106)
Creatinine, Ser: 0.87 mg/dL (ref 0.57–1.00)
Glucose: 81 mg/dL (ref 70–99)
Potassium: 4.9 mmol/L (ref 3.5–5.2)
Sodium: 140 mmol/L (ref 134–144)
eGFR: 84 mL/min/{1.73_m2} (ref >59)

## 2021-09-23 LAB — LIPID PANEL
Chol/HDL Ratio: 3.3 ratio (ref 0.0–4.4)
Cholesterol, Total: 242 mg/dL — ABNORMAL HIGH (ref 100–199)
HDL: 73 mg/dL (ref >39)
LDL Chol Calc (NIH): 148 mg/dL — ABNORMAL HIGH (ref 0–99)
Triglycerides: 123 mg/dL (ref 0–149)
VLDL Cholesterol Cal: 21 mg/dL (ref 5–40)

## 2021-09-23 LAB — HEMOGLOBIN A1C
Est. average glucose Bld gHb Est-mCnc: 120 mg/dL
Hgb A1c MFr Bld: 5.8 % — ABNORMAL HIGH (ref 4.8–5.6)

## 2021-09-23 LAB — TSH: TSH: 0.975 u[IU]/mL (ref 0.450–4.500)

## 2021-09-30 ENCOUNTER — Other Ambulatory Visit (HOSPITAL_COMMUNITY): Payer: Self-pay

## 2021-09-30 ENCOUNTER — Encounter: Payer: BLUE CROSS/BLUE SHIELD | Admitting: Radiology

## 2021-10-14 ENCOUNTER — Ambulatory Visit: Payer: BLUE CROSS/BLUE SHIELD | Admitting: Radiology

## 2021-10-14 ENCOUNTER — Other Ambulatory Visit (HOSPITAL_COMMUNITY)
Admission: RE | Admit: 2021-10-14 | Discharge: 2021-10-14 | Disposition: A | Discharge status: Home / Self Care | Payer: BLUE CROSS/BLUE SHIELD | Source: Ambulatory Visit | Attending: Radiology | Admitting: Radiology

## 2021-10-14 ENCOUNTER — Encounter: Payer: Self-pay | Admitting: Radiology

## 2021-10-14 ENCOUNTER — Other Ambulatory Visit (HOSPITAL_COMMUNITY): Payer: Self-pay

## 2021-10-14 VITALS — BP 102/64 | Ht 68.25 in | Wt 171.0 lb

## 2021-10-14 DIAGNOSIS — N914 Secondary oligomenorrhea: Secondary | ICD-10-CM

## 2021-10-14 DIAGNOSIS — Z01419 Encounter for gynecological examination (general) (routine) without abnormal findings: Secondary | ICD-10-CM

## 2021-10-14 DIAGNOSIS — Z113 Encounter for screening for infections with a predominantly sexual mode of transmission: Secondary | ICD-10-CM

## 2021-10-14 MED ORDER — MEDROXYPROGESTERONE ACETATE 10 MG PO TABS
10.0000 mg | ORAL_TABLET | Freq: Every day | ORAL | 3 refills | Status: DC
  Filled 2021-10-14: qty 10, 10d supply, fill #0

## 2021-10-14 NOTE — Progress Notes (Signed)
Glenda Harrell [birth year] [id number]   History:  46 y.o. G3P1 presents for annual exam as a new patient. Recently move here from [place]. Would like STI screening today. Last sexual encounter 1 month ago. Reports skipping periods and night sweats for the past year.  Gynecologic History Patient's last menstrual period was 07/03/2021 (approximate). Period Pattern: (!) Irregular (skipping periods x's 1 year) Contraception/Family planning: condoms Sexually active: yes Last Pap: 2017. Results were: normal Last mammogram: 2022. Results were: normal  Obstetric History OB History  Gravida Para Term Preterm AB Living  3 1       1   SAB IAB Ectopic Multiple Live Births          1    # Outcome Date GA Lbr Len/2nd Weight Sex Delivery Anes PTL Lv  3 Gravida           2 Gravida           1 Para              The following portions of the patient's history were reviewed and updated as appropriate: allergies, current medications, past family history, past medical history, past social history, past surgical history, and problem list.  Review of Systems Pertinent items noted in HPI and remainder of comprehensive ROS otherwise negative.   Past medical history, past surgical history, family history and social history were all reviewed and documented in the EPIC chart.   Exam:  Vitals:   10/14/21 0745  BP: 102/64  Weight: 171 lb (77.6 kg)  Height: 5' 8.25" (1.734 m)   Body mass index is 25.81 kg/m.  General appearance:  Normal Thyroid:  Symmetrical, normal in size, without palpable masses or nodularity. Respiratory  Auscultation:  Clear without wheezing or rhonchi Cardiovascular  Auscultation:  Regular rate, without rubs, murmurs or gallops  Edema/varicosities:  Not grossly evident Abdominal  Soft,nontender, without masses, guarding or rebound.  Liver/spleen:  No organomegaly noted  Hernia:  None appreciated  Skin  Inspection:  Grossly normal Breasts: Examined lying and  sitting.   Right: Without masses, retractions, nipple discharge or axillary adenopathy.   Left: Without masses, retractions, nipple discharge or axillary adenopathy. Genitourinary   Inguinal/mons:  Normal without inguinal adenopathy  External genitalia:  Normal appearing vulva with no masses, tenderness, or lesions  BUS/Urethra/Skene's glands:  Normal without masses or exudate  Vagina:  Normal appearing with normal color and discharge, no lesions  Cervix:  Normal appearing without discharge or lesions  Uterus:  Normal in size, shape and contour.  Mobile, nontender  Adnexa/parametria:     Rt: Normal in size, without masses or tenderness.   Lt: Normal in size, without masses or tenderness.  Anus and perineum: Normal   Patient informed chaperone available to be present for breast and pelvic exam. Patient has requested no chaperone to be present. Patient has been advised what will be completed during breast and pelvic exam.   Assessment/Plan:   1. Well woman exam with routine gynecological exam - Cytology - PAP( [facility])  2. Screen for sexually transmitted diseases Gc/ct/trich with pap - HIV antibody (with reflex) - RPR - Hepatitis C antibody  3. Secondary oligomenorrhea Declines hormonal management at this time. Will to take provera to induce a withdrawal bleed. - medroxyPROGESTERone (PROVERA) 10 MG tablet; Take 1 tablet (10 mg total) by mouth daily.  Dispense: 10 tablet; Refill: 3   May try black cohosh for night sweats Magnesium glycinate to help with sleep  Discussed SBE,  colonoscopy and DEXA screening as directed/appropriate. Recommend of exercise weekly, including weight bearing exercise. Encouraged the use of seatbelts and sunscreen. Return in 1 year for annual or as needed.   Arlie Solomons B WHNP-BC 8:11 AM 10/14/2021

## 2021-10-15 LAB — RPR: RPR Ser Ql: NONREACTIVE

## 2021-10-15 LAB — HIV ANTIBODY (ROUTINE TESTING W REFLEX): HIV 1&2 Ab, 4th Generation: NONREACTIVE

## 2021-10-15 LAB — HEPATITIS C ANTIBODY: Hepatitis C Ab: NONREACTIVE

## 2021-10-16 LAB — CYTOLOGY - PAP
Adequacy: ABSENT
Chlamydia: NEGATIVE
Comment: NEGATIVE
Comment: NEGATIVE
Comment: NEGATIVE
Comment: NORMAL
Diagnosis: NEGATIVE
High risk HPV: NEGATIVE
Neisseria Gonorrhea: NEGATIVE
Trichomonas: NEGATIVE

## 2021-10-19 ENCOUNTER — Ambulatory Visit: Payer: BLUE CROSS/BLUE SHIELD

## 2021-10-27 ENCOUNTER — Other Ambulatory Visit (HOSPITAL_COMMUNITY): Payer: Self-pay

## 2021-10-27 MED ORDER — CLENPIQ 10-3.5-12 MG-GM -GM/175ML PO SOLN
175.0000 mL | ORAL | 0 refills | Status: DC
  Filled 2021-10-27: qty 350, 2d supply, fill #0

## 2021-10-28 ENCOUNTER — Other Ambulatory Visit (HOSPITAL_COMMUNITY): Payer: Self-pay

## 2021-12-02 ENCOUNTER — Ambulatory Visit
Admission: RE | Admit: 2021-12-02 | Discharge: 2021-12-02 | Disposition: A | Discharge status: Home / Self Care | Payer: BLUE CROSS/BLUE SHIELD | Source: Ambulatory Visit | Attending: Nurse Practitioner | Admitting: Nurse Practitioner

## 2021-12-02 DIAGNOSIS — Z1231 Encounter for screening mammogram for malignant neoplasm of breast: Secondary | ICD-10-CM

## 2021-12-04 ENCOUNTER — Other Ambulatory Visit: Payer: Self-pay | Admitting: Nurse Practitioner

## 2021-12-04 DIAGNOSIS — R928 Other abnormal and inconclusive findings on diagnostic imaging of breast: Secondary | ICD-10-CM

## 2021-12-30 ENCOUNTER — Other Ambulatory Visit: Payer: BLUE CROSS/BLUE SHIELD

## 2022-02-09 ENCOUNTER — Encounter: Payer: BLUE CROSS/BLUE SHIELD | Admitting: Nurse Practitioner

## 2022-02-09 NOTE — Progress Notes (Unsigned)
No show

## 2022-06-27 IMAGING — MG DIGITAL SCREENING BILAT W/ CAD
5 series · 5 of 5 positions shown · non-contrast
Comparison: None.
COMPARISON: None.

Addendum:
CLINICAL DATA: Screening.

EXAM:
DIGITAL SCREENING BILATERAL MAMMOGRAM WITH CAD
TECHNIQUE: Bilateral screening digital craniocaudal and mediolateral oblique
mammograms were obtained. The images were evaluated with
computer-aided detection.

[R CC (1 of 2)]
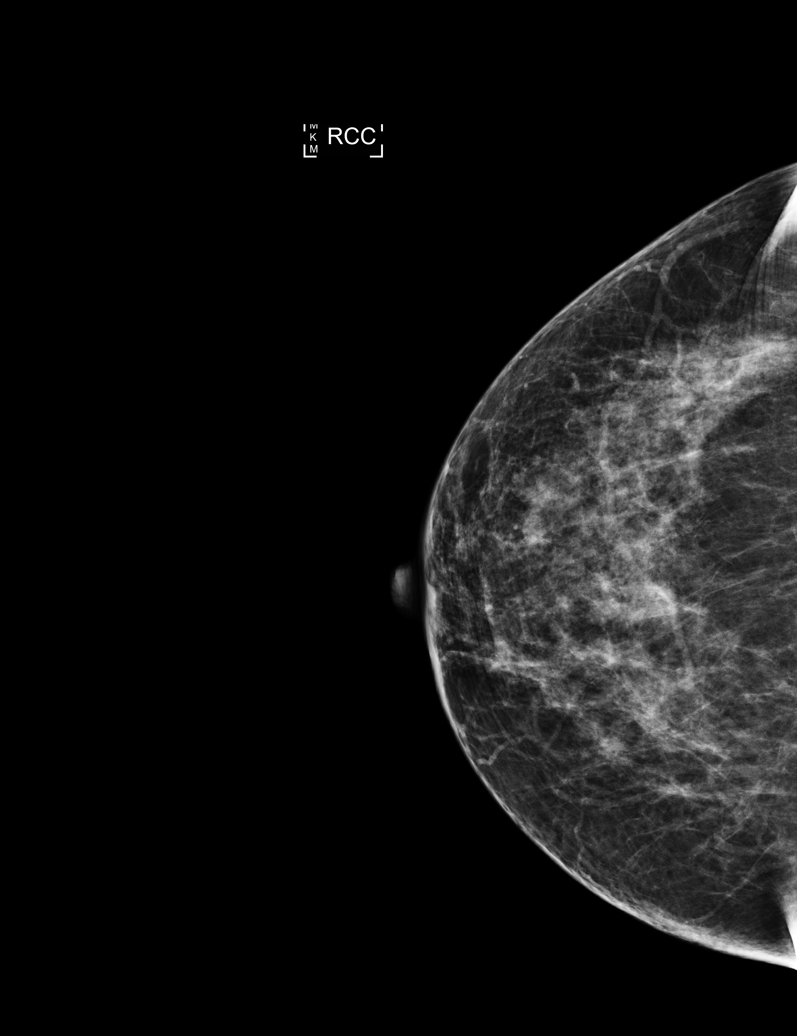

[R CC (2 of 2)]
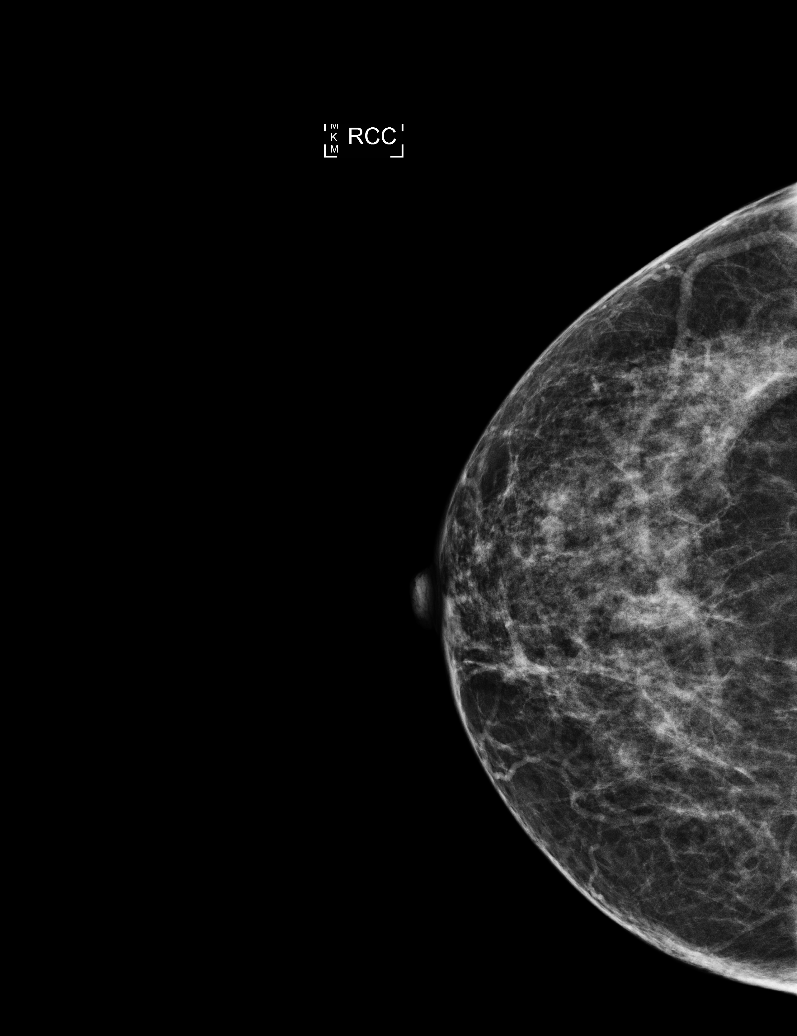

[L MLO]
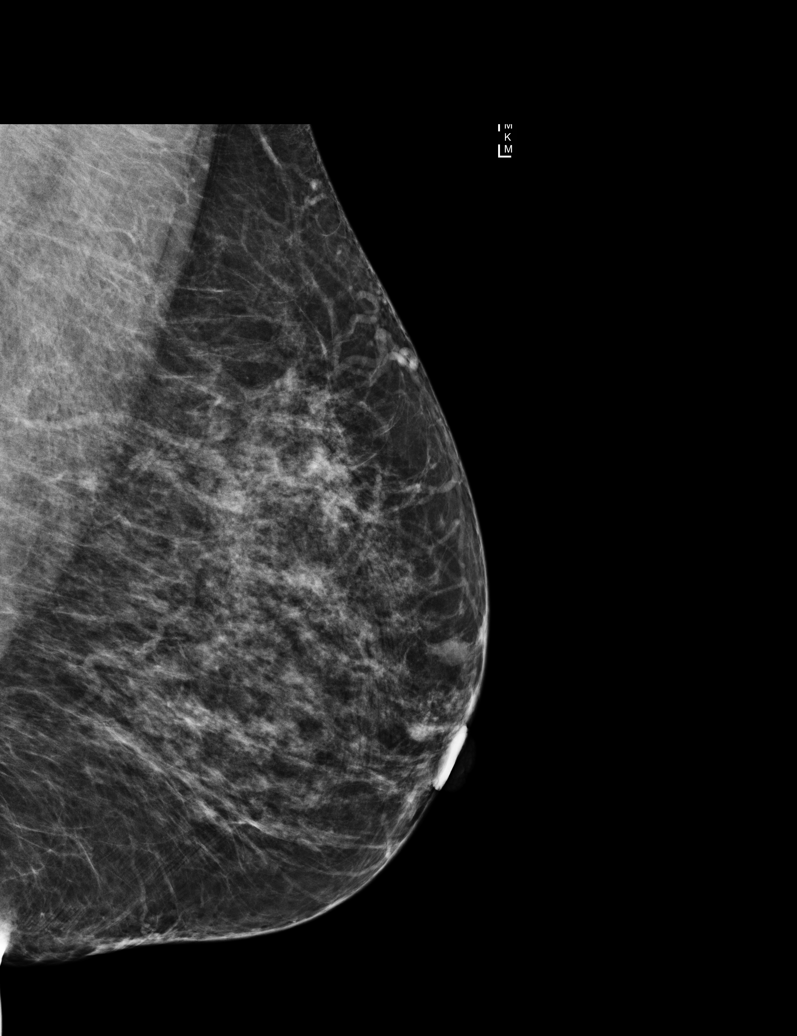

[R MLO]
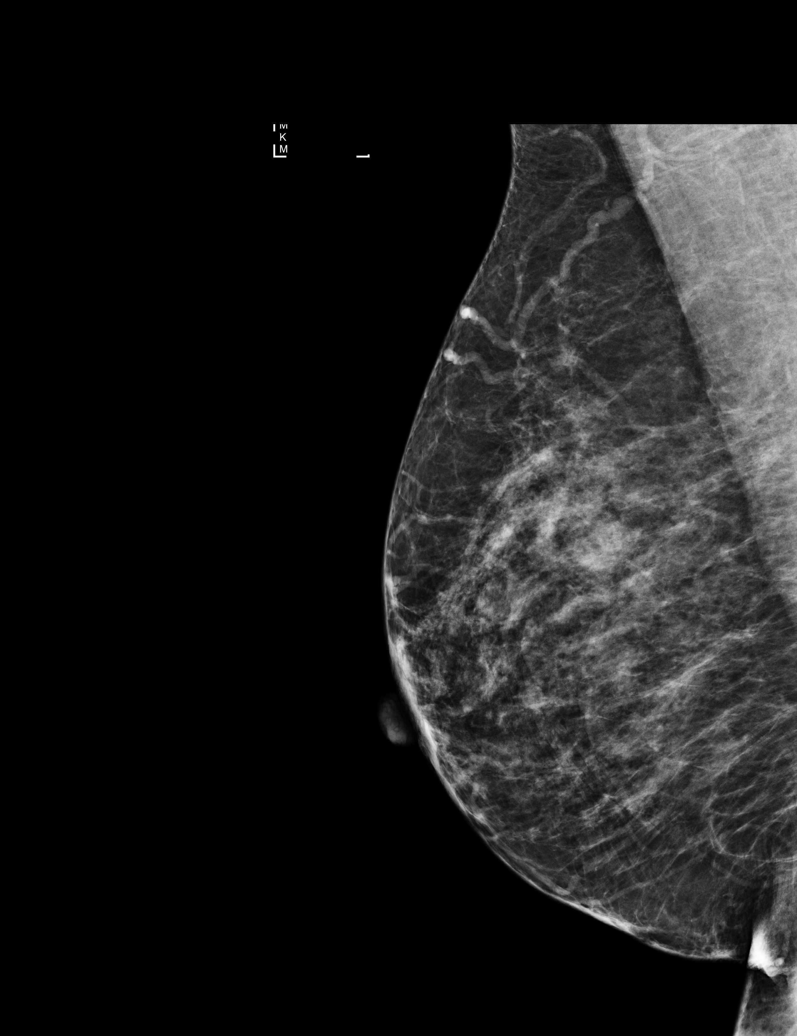

[L CC]
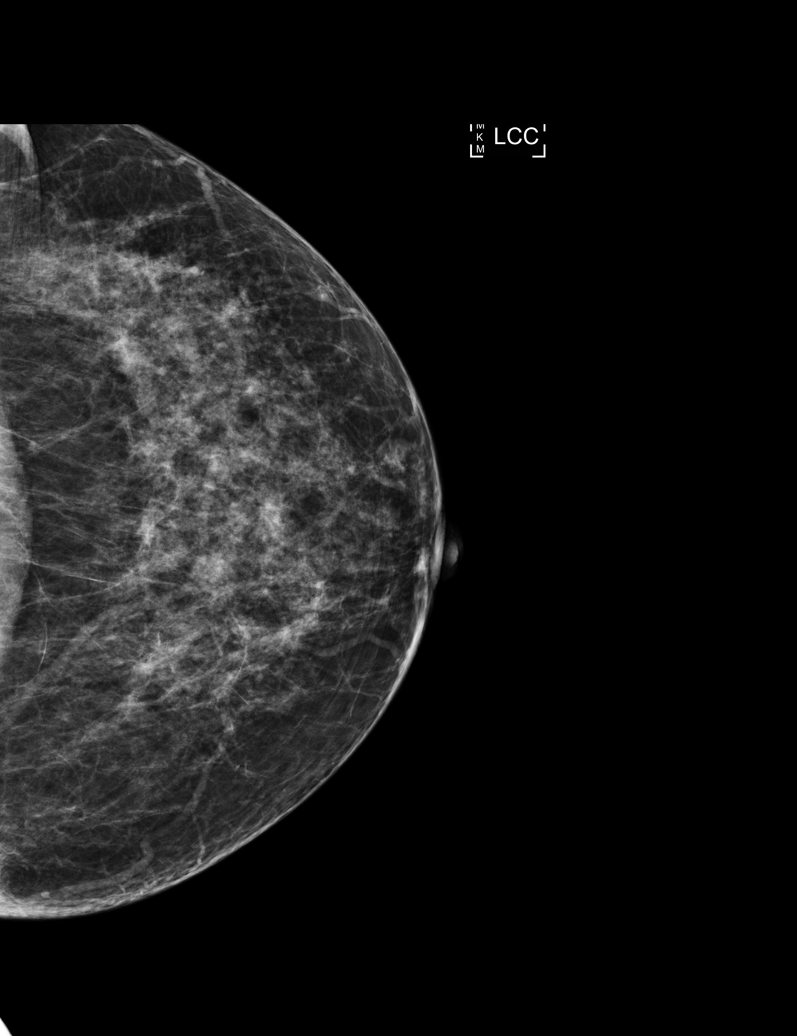

[5 of 5 positions shown; findings below may reference images not displayed]

ACR Breast Density Category c: The breast tissue is heterogeneously
dense, which may obscure small masses.
FINDINGS: In the right breast a possible asymmetry requires further
evaluation.

In the left breast a possible mass requires further evaluation.
IMPRESSION: Further evaluation is suggested for possible  in the right breast.

Further evaluation is suggested for possible  in the left breast.

RECOMMENDATION:
Diagnostic mammogram and possibly ultrasound of both breasts.
(Code:VY-6-KKQ)

The patient will be contacted regarding the findings, and additional
imaging will be scheduled.

BI-RADS CATEGORY  0: Incomplete. Need additional imaging evaluation
and/or prior mammograms for comparison.
IMPRESSION: Further evaluation is suggested for a possible asymmetry in the
right breast.

Further evaluation is suggested for a possible mass in the left
breast.

*** End of Addendum ***
ACR Breast Density Category c: The breast tissue is heterogeneously
dense, which may obscure small masses.
FINDINGS: In the right breast a possible asymmetry requires further
evaluation.

In the left breast a possible mass requires further evaluation.
IMPRESSION: Further evaluation is suggested for possible  in the right breast.

Further evaluation is suggested for possible  in the left breast.

RECOMMENDATION:
Diagnostic mammogram and possibly ultrasound of both breasts.
(Code:VY-6-KKQ)

The patient will be contacted regarding the findings, and additional
imaging will be scheduled.

BI-RADS CATEGORY  0: Incomplete. Need additional imaging evaluation
and/or prior mammograms for comparison.

## 2023-02-21 DIAGNOSIS — Z1231 Encounter for screening mammogram for malignant neoplasm of breast: Secondary | ICD-10-CM

## 2023-03-01 ENCOUNTER — Ambulatory Visit (INDEPENDENT_AMBULATORY_CARE_PROVIDER_SITE_OTHER): Payer: BC Managed Care – PPO | Admitting: Nurse Practitioner

## 2023-03-01 ENCOUNTER — Other Ambulatory Visit (HOSPITAL_COMMUNITY): Payer: Self-pay

## 2023-03-01 VITALS — BP 110/74 | HR 61 | Temp 98.8°F | Ht 68.0 in | Wt 177.4 lb

## 2023-03-01 DIAGNOSIS — Z1211 Encounter for screening for malignant neoplasm of colon: Secondary | ICD-10-CM | POA: Diagnosis not present

## 2023-03-01 DIAGNOSIS — Z13220 Encounter for screening for lipoid disorders: Secondary | ICD-10-CM

## 2023-03-01 DIAGNOSIS — Z0000 Encounter for general adult medical examination without abnormal findings: Secondary | ICD-10-CM | POA: Diagnosis not present

## 2023-03-01 DIAGNOSIS — E559 Vitamin D deficiency, unspecified: Secondary | ICD-10-CM | POA: Diagnosis not present

## 2023-03-01 DIAGNOSIS — Z1231 Encounter for screening mammogram for malignant neoplasm of breast: Secondary | ICD-10-CM

## 2023-03-01 DIAGNOSIS — Z79899 Other long term (current) drug therapy: Secondary | ICD-10-CM

## 2023-03-01 DIAGNOSIS — R7309 Other abnormal glucose: Secondary | ICD-10-CM

## 2023-03-01 MED ORDER — FLONASE SENSIMIST 27.5 MCG/SPRAY NA SUSP
2.0000 | Freq: Every day | NASAL | 2 refills | Status: AC
  Filled 2023-03-01: qty 10, 30d supply, fill #0

## 2023-03-01 NOTE — Progress Notes (Signed)
Madelaine Bhat, CMA,acting as a Neurosurgeon for Arnette Felts, FNP.,have documented all relevant documentation on the behalf of Arnette Felts, FNP,as directed by  Arnette Felts, FNP while in the presence of Arnette Felts, FNP.  Subjective:    Patient ID: Glenda Harrell , female    DOB: 1975/12/17 , 48 y.o.   MRN: [id number]  Chief Complaint  Patient presents with   Annual Exam    HPI  Patient presents today for HM, Patient reports compliance with medication. Patient denies any chest pain, SOB, or headaches. Patient reports she feels build up pressure in her head about 2 times a week. Lasting 15-30 minutes. When she had the glasses the pain goes away. Patient denies any spots or scars on her legs and would like to keep her pants on.   She quit smoking recently. She continues to feel pressure in her head.  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 She does stress about things outside of work.      Past Medical History:  Diagnosis Date   Anemia      Family History  Problem Relation Age of Onset   Stroke Mother    Hypertension Maternal Grandmother      Current Outpatient Medications:    fluticasone (FLONASE SENSIMIST) 27.5 MCG/SPRAY nasal spray, Place 2 sprays into the nose daily., Disp: 10 mL, Rfl: 2   Allergies  Allergen Reactions   Other Anaphylaxis    Shell fish and horseradish      The patient states she is  perimenopause . No LMP recorded (lmp unknown). Patient is perimenopausal.. Negative for Dysmenorrhea and Negative for Menorrhagia. Negative for: breast discharge, breast lump(s), breast pain and breast self exam. Associated symptoms include abnormal vaginal bleeding. Pertinent negatives include abnormal bleeding (hematology), anxiety, decreased libido, depression, difficulty falling sleep, dyspareunia, history of infertility, nocturia, sexual dysfunction, sleep disturbances, urinary incontinence, urinary urgency, vaginal discharge and vaginal itching. Diet regular.The  patient states her exercise level is minimal - not as much as she used to. She is working at a desk more and tends to snack more.   The patient's tobacco use is:  Social History   Tobacco Use  Smoking Status Former   Current packs/day: 0.00   Types: Cigarettes   Quit date: 07/08/2021   Years since quitting: 1.6  Smokeless Tobacco Former   She has been exposed to passive smoke. The patient's alcohol use is:  Social History   Substance and Sexual Activity  Alcohol Use Yes   Comment: social   Additional information: Last pap 10/14/2021, next one scheduled for 10/15/2026.    Review of Systems  Constitutional: Negative.   HENT: Negative.    Eyes: Negative.   Respiratory: Negative.    Cardiovascular: Negative.   Gastrointestinal: Negative.   Endocrine: Negative.   Genitourinary: Negative.   Musculoskeletal: Negative.   Skin: Negative.   Allergic/Immunologic: Negative.   Neurological: Negative.        Head pressure to frontal sinuses and parietal area.   Hematological: Negative.   Psychiatric/Behavioral: Negative.       Today's Vitals   03/01/23 1222  BP: 110/74  Pulse: 61  Temp: 98.8 F (37.1 C)  TempSrc: Oral  Weight: 177 lb 6.4 oz (80.5 kg)  Height: 5\' 8"  (1.727 m)  PainSc: 0-No pain   Body mass index is 26.97 kg/m.  Wt Readings from Last 3 Encounters:  03/01/23 177 lb 6.4 oz (80.5 kg)  10/14/21 171 lb (77.6 kg)  09/22/21  171 lb (77.6 kg)     Objective:  Physical Exam Vitals reviewed.  Constitutional:      General: She is not in acute distress.    Appearance: Normal appearance. She is well-developed. She is obese.  HENT:     Head: Normocephalic and atraumatic.     Right Ear: Hearing, tympanic membrane, ear canal and external ear normal. There is no impacted cerumen.     Left Ear: Hearing, tympanic membrane, ear canal and external ear normal. There is no impacted cerumen.     Nose:     Right Sinus: Frontal sinus tenderness present.     Left Sinus: Frontal  sinus tenderness present.     Mouth/Throat:     Mouth: Mucous membranes are moist.  Eyes:     General: Lids are normal.     Extraocular Movements: Extraocular movements intact.     Conjunctiva/sclera: Conjunctivae normal.     Pupils: Pupils are equal, round, and reactive to light.     Funduscopic exam:    Right eye: No papilledema.        Left eye: No papilledema.  Neck:     Thyroid: No thyroid mass.     Vascular: No carotid bruit.  Cardiovascular:     Rate and Rhythm: Normal rate and regular rhythm.     Pulses: Normal pulses.     Heart sounds: Normal heart sounds. No murmur heard. Pulmonary:     Effort: Pulmonary effort is normal. No respiratory distress.     Breath sounds: Normal breath sounds. No wheezing.  Chest:     Chest wall: No mass.  Breasts:    Tanner Score is 5.     Right: Normal. No mass or tenderness.     Left: Normal. No mass or tenderness.  Abdominal:     General: Abdomen is flat. Bowel sounds are normal. There is no distension.     Palpations: Abdomen is soft.     Tenderness: There is no abdominal tenderness.  Genitourinary:    Rectum: Guaiac result negative.  Musculoskeletal:        General: No swelling. Normal range of motion.     Cervical back: Full passive range of motion without pain, normal range of motion and neck supple.     Right lower leg: No edema.     Left lower leg: No edema.  Lymphadenopathy:     Upper Body:     Right upper body: No supraclavicular, axillary or pectoral adenopathy.     Left upper body: No supraclavicular, axillary or pectoral adenopathy.  Skin:    General: Skin is warm and dry.     Capillary Refill: Capillary refill takes less than 2 seconds.     Findings: No rash.  Neurological:     General: No focal deficit present.     Mental Status: She is alert and oriented to person, place, and time.     Cranial Nerves: No cranial nerve deficit.     Sensory: No sensory deficit.  Psychiatric:        Mood and Affect: Mood normal.         Behavior: Behavior normal.        Thought Content: Thought content normal.        Judgment: Judgment normal.         Assessment And Plan:     Encounter for annual health examination Assessment & Plan: Behavior modifications discussed and diet history reviewed.   Pt will continue to exercise  regularly and modify diet with low GI, plant based foods and decrease intake of processed foods.  Recommend intake of daily multivitamin, Vitamin D, and calcium.  Recommend mammogram and colonoscopy for preventive screenings, as well as recommend immunizations that include influenza, TDAP, and Shingles   Orders: -     CMP14+EGFR  Vitamin D deficiency Assessment & Plan: Will check vitamin D level and supplement as needed.    Also encouraged to spend 15 minutes in the sun daily.    Orders: -     VITAMIN D 25 Hydroxy (Vit-D Deficiency, Fractures)  Abnormal glucose -     Hemoglobin A1c  Screening for colon cancer -     Ambulatory referral to Gastroenterology  Other long term (current) drug therapy -     CBC with Differential/Platelet  Encounter for screening for lipid disorder -     Lipid panel  Encounter for screening mammogram for breast cancer -     Digital Screening Mammogram, Left and Right; Future  Other orders -     Flonase Sensimist; Place 2 sprays into the nose daily.  Dispense: 10 mL; Refill: 2     Return for 1 year physical, 43m pre dm. Patient was given opportunity to ask questions. Patient verbalized understanding of the plan and was able to repeat key elements of the plan. All questions were answered to their satisfaction.   Arnette Felts, FNP  I, Arnette Felts, FNP, have reviewed all documentation for this visit. The documentation on 03/01/23 for the exam, diagnosis, procedures, and orders are all accurate and complete.

## 2023-03-02 LAB — CBC WITH DIFFERENTIAL/PLATELET
Basophils Absolute: 0 10*3/uL (ref 0.0–0.2)
Basos: 0 %
EOS (ABSOLUTE): 0 10*3/uL (ref 0.0–0.4)
Eos: 1 %
Hematocrit: 46.4 % (ref 34.0–46.6)
Hemoglobin: 15.1 g/dL (ref 11.1–15.9)
Immature Grans (Abs): 0 10*3/uL (ref 0.0–0.1)
Immature Granulocytes: 0 %
Lymphocytes Absolute: 2 10*3/uL (ref 0.7–3.1)
Lymphs: 39 %
MCH: 29.8 pg (ref 26.6–33.0)
MCHC: 32.5 g/dL (ref 31.5–35.7)
MCV: 92 fL (ref 79–97)
Monocytes Absolute: 0.4 10*3/uL (ref 0.1–0.9)
Monocytes: 8 %
Neutrophils Absolute: 2.6 10*3/uL (ref 1.4–7.0)
Neutrophils: 52 %
Platelets: 280 10*3/uL (ref 150–450)
RBC: 5.07 x10E6/uL (ref 3.77–5.28)
RDW: 12.5 % (ref 11.7–15.4)
WBC: 5 10*3/uL (ref 3.4–10.8)

## 2023-03-02 LAB — LIPID PANEL
Chol/HDL Ratio: 2.9 {ratio} (ref 0.0–4.4)
Cholesterol, Total: 258 mg/dL — ABNORMAL HIGH (ref 100–199)
HDL: 89 mg/dL (ref >39)
LDL Chol Calc (NIH): 144 mg/dL — ABNORMAL HIGH (ref 0–99)
Triglycerides: 143 mg/dL (ref 0–149)
VLDL Cholesterol Cal: 25 mg/dL (ref 5–40)

## 2023-03-02 LAB — CMP14+EGFR
ALT: 11 [IU]/L (ref 0–32)
AST: 18 [IU]/L (ref 0–40)
Albumin: 4.7 g/dL (ref 3.9–4.9)
Alkaline Phosphatase: 134 [IU]/L — ABNORMAL HIGH (ref 44–121)
BUN/Creatinine Ratio: 10 (ref 9–23)
BUN: 12 mg/dL (ref 6–24)
Bilirubin Total: 0.3 mg/dL (ref 0.0–1.2)
CO2: 24 mmol/L (ref 20–29)
Calcium: 10 mg/dL (ref 8.7–10.2)
Chloride: 101 mmol/L (ref 96–106)
Creatinine, Ser: 1.15 mg/dL — ABNORMAL HIGH (ref 0.57–1.00)
Globulin, Total: 2.6 g/dL (ref 1.5–4.5)
Glucose: 96 mg/dL (ref 70–99)
Potassium: 4.6 mmol/L (ref 3.5–5.2)
Sodium: 141 mmol/L (ref 134–144)
Total Protein: 7.3 g/dL (ref 6.0–8.5)
eGFR: 59 mL/min/{1.73_m2} — ABNORMAL LOW (ref >59)

## 2023-03-02 LAB — VITAMIN D 25 HYDROXY (VIT D DEFICIENCY, FRACTURES): Vit D, 25-Hydroxy: 10 ng/mL — ABNORMAL LOW (ref 30.0–100.0)

## 2023-03-02 LAB — HEMOGLOBIN A1C
Est. average glucose Bld gHb Est-mCnc: 114 mg/dL
Hgb A1c MFr Bld: 5.6 % (ref 4.8–5.6)

## 2023-03-09 ENCOUNTER — Encounter: Payer: Self-pay | Admitting: Nurse Practitioner

## 2023-03-09 DIAGNOSIS — Z1211 Encounter for screening for malignant neoplasm of colon: Secondary | ICD-10-CM | POA: Insufficient documentation

## 2023-03-09 DIAGNOSIS — Z0000 Encounter for general adult medical examination without abnormal findings: Secondary | ICD-10-CM | POA: Insufficient documentation

## 2023-03-09 DIAGNOSIS — E559 Vitamin D deficiency, unspecified: Secondary | ICD-10-CM | POA: Insufficient documentation

## 2023-03-09 DIAGNOSIS — Z1231 Encounter for screening mammogram for malignant neoplasm of breast: Secondary | ICD-10-CM | POA: Insufficient documentation

## 2023-03-09 DIAGNOSIS — R7309 Other abnormal glucose: Secondary | ICD-10-CM | POA: Insufficient documentation

## 2023-03-09 MED ORDER — VITAMIN D (ERGOCALCIFEROL) 1.25 MG (50000 UNIT) PO CAPS
50000.0000 [IU] | ORAL_CAPSULE | ORAL | 1 refills | Status: AC
  Filled 2023-03-09: qty 24, 84d supply, fill #0

## 2023-03-09 NOTE — Patient Instructions (Signed)
Health Maintenance  Topic Date Due   Colon Cancer Screening  Never done   Mammogram  12/03/2022   COVID-19 Vaccine (4 - 2024-25 season) 03/17/2023*   DTaP/Tdap/Td vaccine (2 - Td or Tdap) 03/26/2025   Pap with HPV screening  10/15/2026   Flu Shot  Completed   Hepatitis C Screening  Completed   HIV Screening  Completed   HPV Vaccine  Aged Out  *Topic was postponed. The date shown is not the original due date.

## 2023-03-09 NOTE — Assessment & Plan Note (Signed)
Will check vitamin D level and supplement as needed.    Also encouraged to spend 15 minutes in the sun daily.

## 2023-03-09 NOTE — Assessment & Plan Note (Signed)
HgbA1c stable, continue focusing on healthy diet low in sugar and starches

## 2023-03-09 NOTE — Assessment & Plan Note (Signed)

## 2023-03-10 ENCOUNTER — Other Ambulatory Visit (HOSPITAL_COMMUNITY): Payer: Self-pay

## 2023-03-15 ENCOUNTER — Other Ambulatory Visit: Payer: Self-pay | Admitting: Nurse Practitioner

## 2023-03-15 DIAGNOSIS — R928 Other abnormal and inconclusive findings on diagnostic imaging of breast: Secondary | ICD-10-CM

## 2023-03-18 ENCOUNTER — Ambulatory Visit
Admission: RE | Admit: 2023-03-18 | Discharge: 2023-03-18 | Disposition: A | Discharge status: Home / Self Care | Payer: BC Managed Care – PPO | Source: Ambulatory Visit | Attending: Nurse Practitioner | Admitting: Nurse Practitioner

## 2023-03-18 ENCOUNTER — Ambulatory Visit: Admission: RE | Admit: 2023-03-18 | Payer: BC Managed Care – PPO | Source: Ambulatory Visit

## 2023-03-18 DIAGNOSIS — R928 Other abnormal and inconclusive findings on diagnostic imaging of breast: Secondary | ICD-10-CM

## 2023-03-22 ENCOUNTER — Other Ambulatory Visit (HOSPITAL_COMMUNITY): Payer: Self-pay

## 2023-03-25 DIAGNOSIS — Z1231 Encounter for screening mammogram for malignant neoplasm of breast: Secondary | ICD-10-CM

## 2023-06-01 ENCOUNTER — Ambulatory Visit: Admitting: Radiology

## 2023-08-10 ENCOUNTER — Encounter: Payer: Self-pay | Admitting: Internal Medicine

## 2023-08-23 ENCOUNTER — Telehealth: Payer: Self-pay

## 2023-08-23 ENCOUNTER — Encounter

## 2023-08-23 NOTE — Telephone Encounter (Signed)
 No Show, no answer.  Left message letting the patient know that they missed the PV appointment and that she needed to call back by 4pm today or the colonoscopy would be cancelled.

## 2023-08-23 NOTE — Telephone Encounter (Signed)
 No show PV appt.  No answer, left message that I was trying to reach her for her pre visit appt.  Will call back in 5 min

## 2023-08-29 ENCOUNTER — Ambulatory Visit: Payer: BC Managed Care – PPO | Admitting: Nurse Practitioner

## 2023-09-06 ENCOUNTER — Encounter: Admitting: Internal Medicine

## 2024-02-29 NOTE — Progress Notes (Unsigned)
 LILLETTE Kristeen JINNY Gladis, CMA,acting as a neurosurgeon for Gaines Ada, FNP.,have documented all relevant documentation on the behalf of Gaines Ada, FNP,as directed by  Gaines Ada, FNP while in the presence of Gaines Ada, FNP.  Subjective:    Patient ID: Glenda Harrell , female    DOB: 09/07/1975 , 49 y.o.   MRN: [id number]  No chief complaint on file.   HPI  HPI   Past Medical History:  Diagnosis Date   Anemia      Family History  Problem Relation Age of Onset   Stroke Mother    Hypertension Maternal Grandmother     Current Medications[1]   Allergies[2]    The patient states she uses {contraceptive methods:5051} for birth control. No LMP recorded. Patient is perimenopausal.. {Dysmenorrhea-menorrhagia:21918}. Negative for: breast discharge, breast lump(s), breast pain and breast self exam. Associated symptoms include abnormal vaginal bleeding. Pertinent negatives include abnormal bleeding (hematology), anxiety, decreased libido, depression, difficulty falling sleep, dyspareunia, history of infertility, nocturia, sexual dysfunction, sleep disturbances, urinary incontinence, urinary urgency, vaginal discharge and vaginal itching. Diet regular.The patient states her exercise level is    . The patient's tobacco use is: Tobacco Use History[3]. She has been exposed to passive smoke. The patient's alcohol use is:  Social History   Substance and Sexual Activity  Alcohol Use Yes   Comment: social  . Additional information: Last pap ***, next one scheduled for ***.    Review of Systems   There were no vitals filed for this visit. There is no height or weight on file to calculate BMI.  Wt Readings from Last 3 Encounters:  03/01/23 177 lb 6.4 oz (80.5 kg)  10/14/21 171 lb (77.6 kg)  09/22/21 171 lb (77.6 kg)     Objective:  Physical Exam      Assessment And Plan:     Encounter for annual health examination  Vitamin D  deficiency  Abnormal glucose     No follow-ups on  file. Patient was given opportunity to ask questions. Patient verbalized understanding of the plan and was able to repeat key elements of the plan. All questions were answered to their satisfaction.   Gaines Ada, FNP  I, Gaines Ada, FNP, have reviewed all documentation for this visit. The documentation on 02/29/24 for the exam, diagnosis, procedures, and orders are all accurate and complete.     [1]  Current Outpatient Medications:    fluticasone  (FLONASE  SENSIMIST) 27.5 MCG/SPRAY nasal spray, Place 2 sprays into the nose daily., Disp: 10 mL, Rfl: 2   Vitamin D , Ergocalciferol , (DRISDOL ) 1.25 MG (50000 UNIT) CAPS capsule, Take 1 capsule (50,000 Units total) by mouth 2 (two) times a week., Disp: 24 capsule, Rfl: 1 [2]  Allergies Allergen Reactions   Other Anaphylaxis    Shell fish and horseradish  [3]  Social History Tobacco Use  Smoking Status Former   Current packs/day: 0.00   Average packs/day: 0.5 packs/day   Types: Cigarettes   Quit date: 07/08/2021   Years since quitting: 2.6  Smokeless Tobacco Former

## 2024-03-01 ENCOUNTER — Encounter: Payer: BC Managed Care – PPO | Admitting: Nurse Practitioner

## 2024-03-01 DIAGNOSIS — Z0000 Encounter for general adult medical examination without abnormal findings: Secondary | ICD-10-CM

## 2024-03-01 DIAGNOSIS — R7309 Other abnormal glucose: Secondary | ICD-10-CM

## 2024-03-01 DIAGNOSIS — E559 Vitamin D deficiency, unspecified: Secondary | ICD-10-CM
